# Patient Record
Sex: Male | Born: 1985 | Hispanic: No | Marital: Married | Smoking: Current every day smoker
Health system: Southern US, Community
[De-identification: ages and names within clinical notes are randomized; demographics above are authoritative.]

## PROBLEM LIST (undated history)

## (undated) DIAGNOSIS — J45909 Unspecified asthma, uncomplicated: Secondary | ICD-10-CM

## (undated) DIAGNOSIS — K5010 Crohn's disease of large intestine without complications: Secondary | ICD-10-CM

---

## 2019-04-20 ENCOUNTER — Other Ambulatory Visit: Payer: Self-pay | Admitting: Gastroenterology

## 2019-04-20 DIAGNOSIS — K289 Gastrojejunal ulcer, unspecified as acute or chronic, without hemorrhage or perforation: Secondary | ICD-10-CM

## 2019-04-21 ENCOUNTER — Other Ambulatory Visit: Payer: Self-pay | Admitting: Gastroenterology

## 2019-04-28 ENCOUNTER — Inpatient Hospital Stay: Admission: RE | Admit: 2019-04-28 | Payer: Self-pay | Source: Ambulatory Visit

## 2019-05-02 ENCOUNTER — Other Ambulatory Visit (HOSPITAL_COMMUNITY): Payer: Self-pay | Admitting: Gastroenterology

## 2019-05-02 DIAGNOSIS — K633 Ulcer of intestine: Secondary | ICD-10-CM

## 2019-05-02 DIAGNOSIS — K289 Gastrojejunal ulcer, unspecified as acute or chronic, without hemorrhage or perforation: Secondary | ICD-10-CM

## 2019-05-16 ENCOUNTER — Ambulatory Visit (HOSPITAL_COMMUNITY)
Admission: RE | Admit: 2019-05-16 | Discharge: 2019-05-16 | Disposition: A | Discharge status: Home / Self Care | Payer: No Typology Code available for payment source | Source: Ambulatory Visit | Attending: Gastroenterology | Admitting: Gastroenterology

## 2019-05-16 ENCOUNTER — Other Ambulatory Visit: Payer: Self-pay

## 2019-05-16 DIAGNOSIS — K289 Gastrojejunal ulcer, unspecified as acute or chronic, without hemorrhage or perforation: Secondary | ICD-10-CM | POA: Insufficient documentation

## 2019-05-16 DIAGNOSIS — K633 Ulcer of intestine: Secondary | ICD-10-CM | POA: Diagnosis not present

## 2019-05-16 MED ORDER — IOHEXOL 300 MG/ML  SOLN
100.0000 mL | Freq: Once | INTRAMUSCULAR | Status: AC | PRN
  Administered 2019-05-16: 100 mL via INTRAVENOUS

## 2020-08-07 ENCOUNTER — Emergency Department (HOSPITAL_BASED_OUTPATIENT_CLINIC_OR_DEPARTMENT_OTHER)
Admission: EM | Admit: 2020-08-07 | Discharge: 2020-08-07 | Disposition: A | Discharge status: Home / Self Care | Payer: No Typology Code available for payment source | Attending: Emergency Medicine | Admitting: Emergency Medicine

## 2020-08-07 ENCOUNTER — Encounter (HOSPITAL_BASED_OUTPATIENT_CLINIC_OR_DEPARTMENT_OTHER): Payer: Self-pay | Admitting: Obstetrics and Gynecology

## 2020-08-07 ENCOUNTER — Other Ambulatory Visit: Payer: Self-pay

## 2020-08-07 ENCOUNTER — Other Ambulatory Visit (HOSPITAL_BASED_OUTPATIENT_CLINIC_OR_DEPARTMENT_OTHER): Payer: Self-pay

## 2020-08-07 DIAGNOSIS — F17210 Nicotine dependence, cigarettes, uncomplicated: Secondary | ICD-10-CM | POA: Insufficient documentation

## 2020-08-07 DIAGNOSIS — H8112 Benign paroxysmal vertigo, left ear: Secondary | ICD-10-CM | POA: Insufficient documentation

## 2020-08-07 DIAGNOSIS — R110 Nausea: Secondary | ICD-10-CM | POA: Diagnosis not present

## 2020-08-07 DIAGNOSIS — R42 Dizziness and giddiness: Secondary | ICD-10-CM | POA: Diagnosis present

## 2020-08-07 DIAGNOSIS — J45909 Unspecified asthma, uncomplicated: Secondary | ICD-10-CM | POA: Diagnosis not present

## 2020-08-07 HISTORY — DX: Crohn's disease of large intestine without complications: K50.10

## 2020-08-07 HISTORY — DX: Unspecified asthma, uncomplicated: J45.909

## 2020-08-07 MED ORDER — MECLIZINE HCL 25 MG PO TABS
25.0000 mg | ORAL_TABLET | Freq: Three times a day (TID) | ORAL | 0 refills | Status: AC | PRN
  Filled 2020-08-07: qty 30, 10d supply, fill #0

## 2020-08-07 MED ORDER — MECLIZINE HCL 25 MG PO TABS
25.0000 mg | ORAL_TABLET | Freq: Once | ORAL | Status: AC
  Administered 2020-08-07: 25 mg via ORAL
  Filled 2020-08-07: qty 1

## 2020-08-07 MED ORDER — DEXAMETHASONE 6 MG PO TABS
10.0000 mg | ORAL_TABLET | Freq: Once | ORAL | Status: AC
  Administered 2020-08-07: 10 mg via ORAL
  Filled 2020-08-07: qty 1

## 2020-08-07 NOTE — ED Provider Notes (Signed)
[facility] EMERGENCY DEPT Provider Note   CSN: [id number] Arrival date & time: 08/07/20  1404     History Chief Complaint  Patient presents with   Dizziness    Edward Brady is a 35 y.o. male.  35 yo M with a chief complaints of dizziness.  This seems to come and go and is worse with certain head positions.  Has been going on for about a week and a half.  Try to make an appointment with an ENT and was told to come here to be evaluated for stroke.  Has had some nausea and some increased fatigue.  Denies one-sided numbness or weakness denies difficulty with speech or swallowing.  Has a mild headache with this.  Denies ear pain or hearing loss.  The history is provided by the patient.  Dizziness Quality:  Head spinning and imbalance Severity:  Moderate Onset quality:  Gradual Duration:  10 days Timing:  Intermittent Progression:  Waxing and waning Chronicity:  New Context: eye movement, head movement and standing up   Relieved by:  Being still and closing eyes Worsened by:  Nothing Ineffective treatments:  None tried Associated symptoms: nausea   Associated symptoms: no chest pain, no diarrhea, no headaches, no palpitations, no shortness of breath and no vomiting       Past Medical History:  Diagnosis Date   Asthma    Crohn's colitis (HCC)     There are no problems to display for this patient.   History reviewed. No pertinent surgical history.     History reviewed. No pertinent family history.  Social History   Tobacco Use   Smoking status: Every Day    Pack years: 0.00    Types: Cigarettes  Vaping Use   Vaping Use: Never used  Substance Use Topics   Alcohol use: Never    Home Medications Prior to Admission medications   Medication Sig Start Date End Date Taking? Authorizing Provider  azaTHIOprine (IMURAN) 50 MG tablet Take 50 mg by mouth daily. 07/20/20  Yes [provider]  HUMIRA PEN 40 MG/0.4ML PNKT Inject 40 mg as directed every  14 (fourteen) days. 07/10/20  Yes [provider]  meclizine (ANTIVERT) 25 MG tablet Take 1 tablet (25 mg total) by mouth 3 (three) times daily as needed for dizziness. 08/07/20  Yes Melene Plan, DO    Allergies    Patient has no allergy information on record.  Review of Systems   Review of Systems  Constitutional:  Positive for fatigue. Negative for chills and fever.  HENT:  Negative for congestion and facial swelling.   Eyes:  Negative for discharge and visual disturbance.  Respiratory:  Negative for shortness of breath.   Cardiovascular:  Negative for chest pain and palpitations.  Gastrointestinal:  Positive for nausea. Negative for abdominal pain, diarrhea and vomiting.  Musculoskeletal:  Negative for arthralgias and myalgias.  Skin:  Negative for color change and rash.  Neurological:  Positive for dizziness. Negative for tremors, syncope and headaches.  Psychiatric/Behavioral:  Negative for confusion and dysphoric mood.    Physical Exam Updated Vital Signs BP 139/83 (BP Location: Right Arm)   Pulse 68   Temp 97.8 F (36.6 C) (Oral)   Resp 15   SpO2 99%   Physical Exam Vitals and nursing note reviewed.  Constitutional:      Appearance: He is well-developed.  HENT:     Head: Normocephalic and atraumatic.  Eyes:     Pupils: Pupils are equal, round, and  reactive to light.  Neck:     Vascular: No JVD.  Cardiovascular:     Rate and Rhythm: Normal rate and regular rhythm.     Heart sounds: No murmur heard.   No friction rub. No gallop.  Pulmonary:     Effort: No respiratory distress.     Breath sounds: No wheezing.  Abdominal:     General: There is no distension.     Tenderness: There is no abdominal tenderness. There is no guarding or rebound.  Musculoskeletal:        General: Normal range of motion.     Cervical back: Normal range of motion and neck supple.  Skin:    Coloration: Skin is not pale.     Findings: No rash.  Neurological:     Mental Status: He  is alert and oriented to person, place, and time.     Cranial Nerves: Cranial nerves are intact.     Sensory: Sensation is intact.     Motor: Motor function is intact.     Coordination: Coordination is intact.     Comments: Right-sided fast-growing nystagmus.  Ambulates without difficulty.  Benign neuro exam otherwise.  Psychiatric:        Behavior: Behavior normal.    ED Results / Procedures / Treatments   Labs (all labs ordered are listed, but only abnormal results are displayed) Labs Reviewed - No data to display  EKG None  Radiology No results found.  Procedures Procedures  CPT [id number].  I discussed therapeutic maneuver for the patient's BPPV.  The patient consented to the procedure.  The patient went through a series of had maneuvers with transient dizziness and improvement.  Feeling mildly better on reassessment.  Medications Ordered in ED Medications  meclizine (ANTIVERT) tablet 25 mg (has no administration in time range)  dexamethasone (DECADRON) tablet 10 mg (has no administration in time range)    ED Course  I have reviewed the triage vital signs and the nursing notes.  Pertinent labs & imaging results that were available during my care of the patient were reviewed by me and considered in my medical decision making (see chart for details).    MDM Rules/Calculators/A&P                          35 yo M with a chief complaints of episodic dizziness that is worse with head positions and resolves when holds his head still.  Most consistent with BPPV.  Dix-Hallpike positive.  Epley maneuver without resolution but mild improvement.  We will have him follow-up with ENT in the office.  Meclizine.  Dose of Decadron for possible labyrinthitis  3:01 PM:  I have discussed the diagnosis/risks/treatment options with the patient and friend  and believe the pt to be eligible for discharge home to follow-up with ENT. We also discussed returning to the ED immediately if new or  worsening sx occur. We discussed the sx which are most concerning (e.g., sudden worsening pain, fever, inability to tolerate by mouth, stroke s/sx) that necessitate immediate return. Medications administered to the patient during their visit and any new prescriptions provided to the patient are listed below.  Medications given during this visit Medications  meclizine (ANTIVERT) tablet 25 mg (has no administration in time range)  dexamethasone (DECADRON) tablet 10 mg (has no administration in time range)     The patient appears reasonably screen and/or stabilized for discharge and I doubt any other medical condition  or other [facility] requiring further screening, evaluation, or treatment in the ED at this time prior to discharge.   Final Clinical Impression(s) / ED Diagnoses Final diagnoses:  BPPV (benign paroxysmal positional vertigo), left    Rx / DC Orders ED Discharge Orders          Ordered    meclizine (ANTIVERT) 25 MG tablet  3 times daily PRN        08/07/20 1452             Melene Plan, DO 08/07/20 1501

## 2020-08-07 NOTE — ED Notes (Signed)
Patient verbalizes understanding of discharge instructions. Opportunity for questioning and answers were provided. Patient discharged from ED.  °

## 2020-08-07 NOTE — ED Triage Notes (Signed)
Patient was sent to the ER by his ENT for dizziness x10 days to rule out stroke. Patient reports increased head pain and dizziness over the last 10 days.

## 2020-08-07 NOTE — Discharge Instructions (Addendum)
Follow up with ENT in the office.  Please return for worsening symptoms difficulty talking swallowing one-sided numbness or weakness.

## 2020-10-28 IMAGING — CT CT ENTEROGRAPHY (ABD-PELV W/ CM)
2 of 6 series · 16 of 46 positions shown, 18 images · IV contrast (Omni 300)
Comparison: None.

CLINICAL DATA: Diffuse ulcerations of jejunum and ileum, abdominal
pain

EXAM:
CT ABDOMEN AND PELVIS WITH CONTRAST (ENTEROGRAPHY)
TECHNIQUE: Multidetector CT of the abdomen and pelvis during bolus
administration of intravenous contrast. Negative oral contrast was
given.
CONTRAST:  100mL OMNIPAQUE IOHEXOL 300 MG/ML  SOLN

[Series 4: entero thins 2mm · axial · 0.86mm/px · z∈[+778,+1220]mm · 13 of 247 slices shown, 15 images]
[im 13/247  soft-tissue]
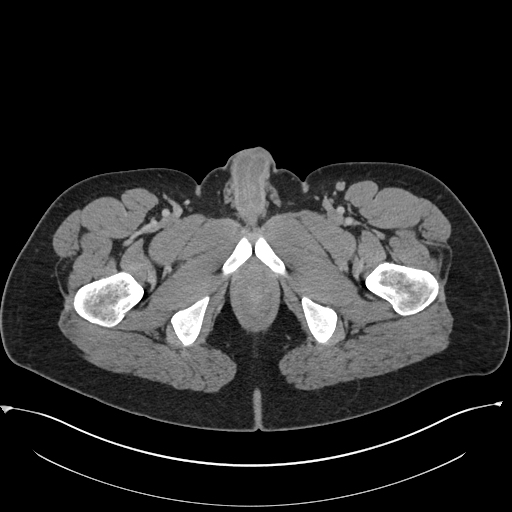
[im 13/247  bone]
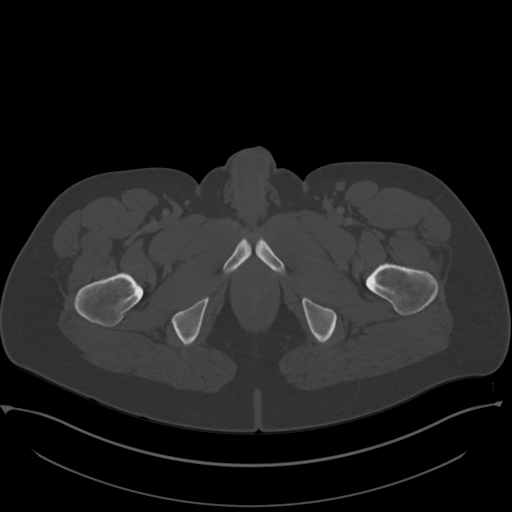
[im 39/247  soft-tissue]
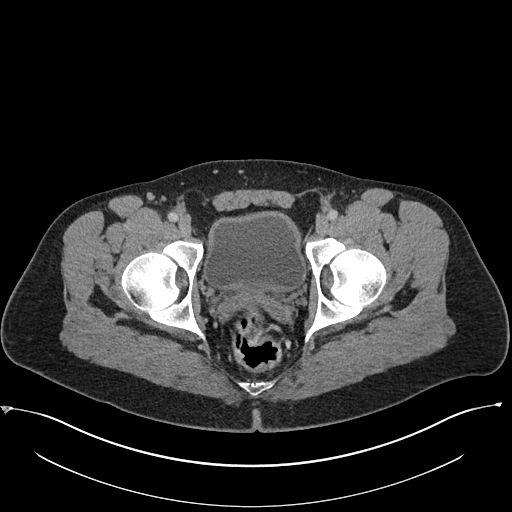
[im 52/247  soft-tissue]
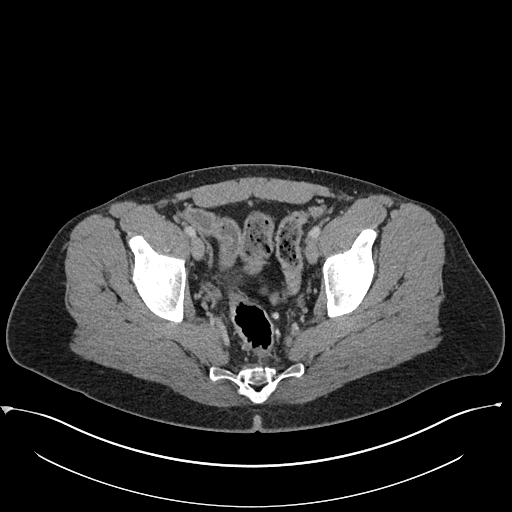
[im 65/247  soft-tissue]
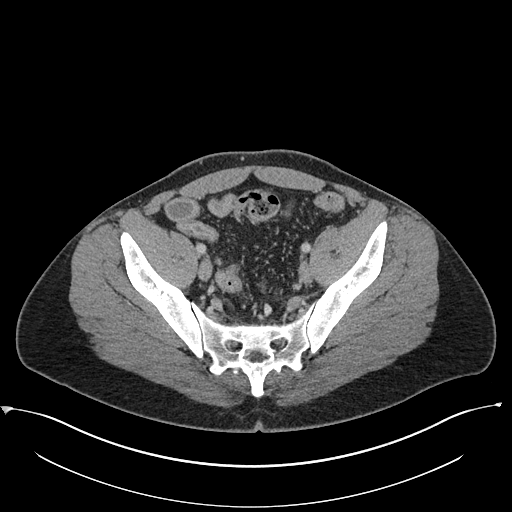
[im 91/247  soft-tissue]
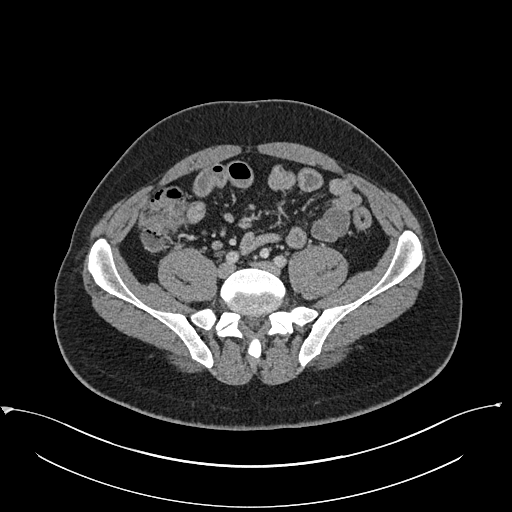
[im 104/247  soft-tissue]
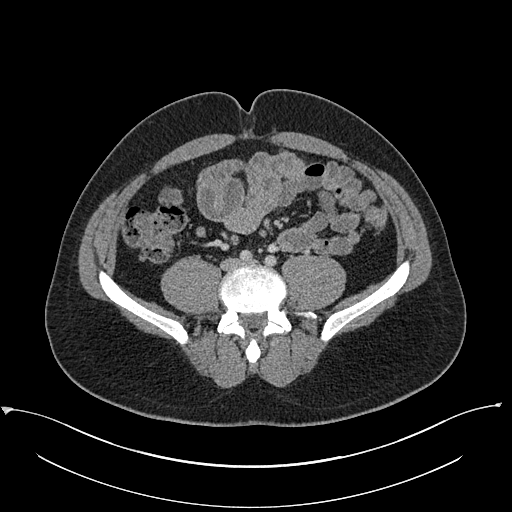
[im 130/247  soft-tissue]
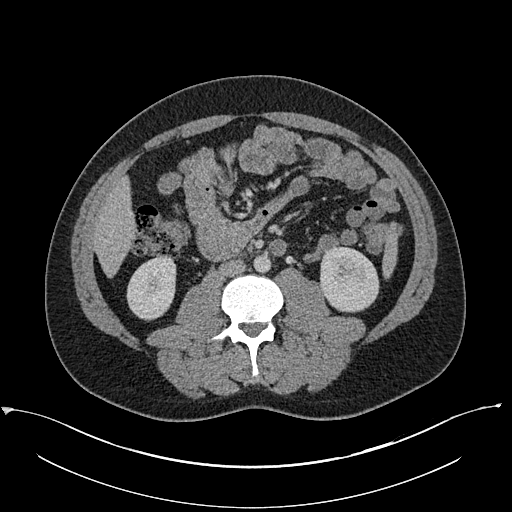
[im 143/247  soft-tissue]
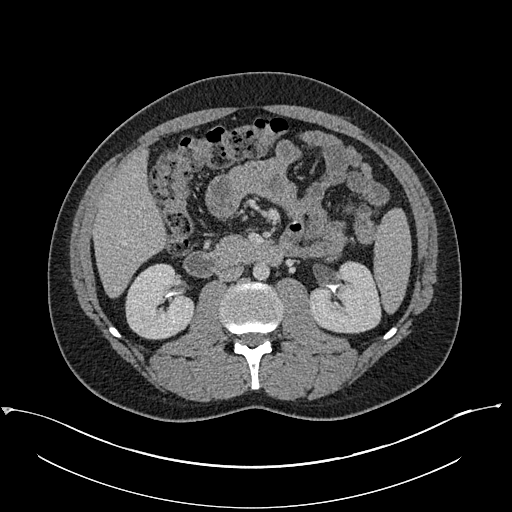
[im 156/247  soft-tissue]
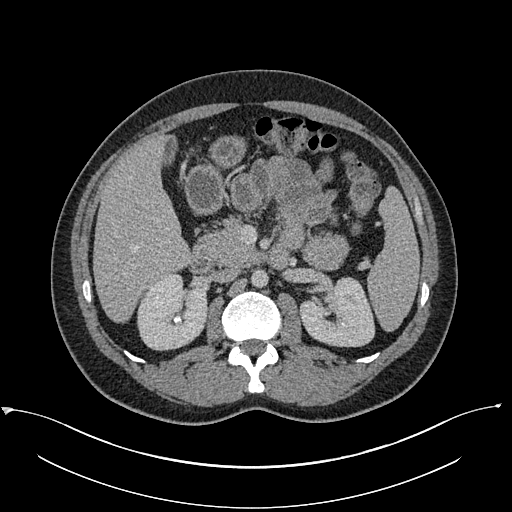
[im 156/247  bone]
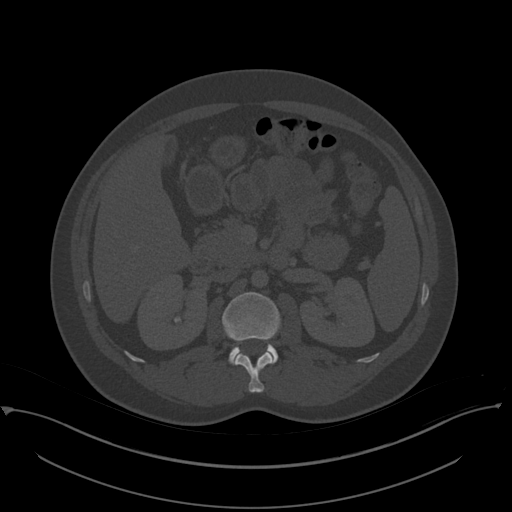
[im 182/247  soft-tissue]
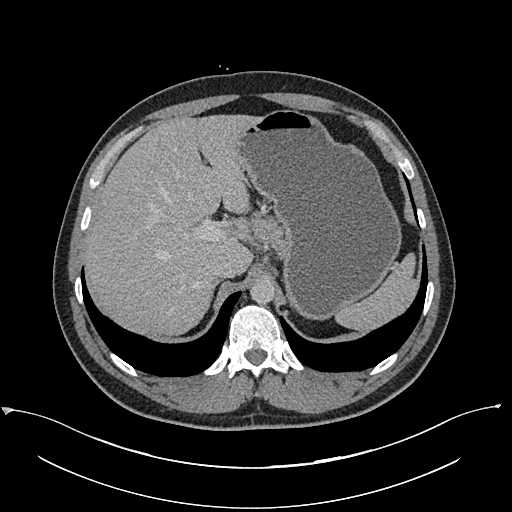
[im 195/247  soft-tissue]
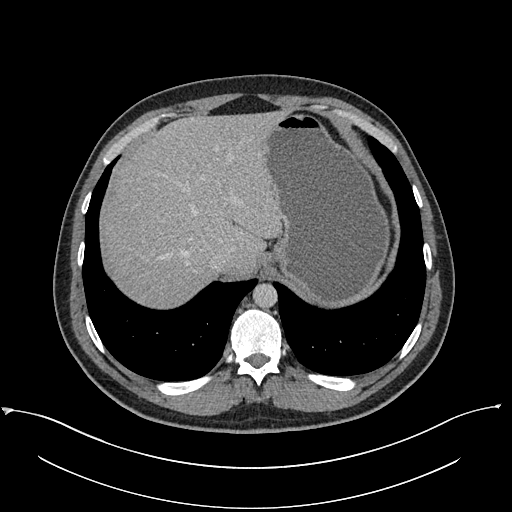
[im 208/247  soft-tissue]
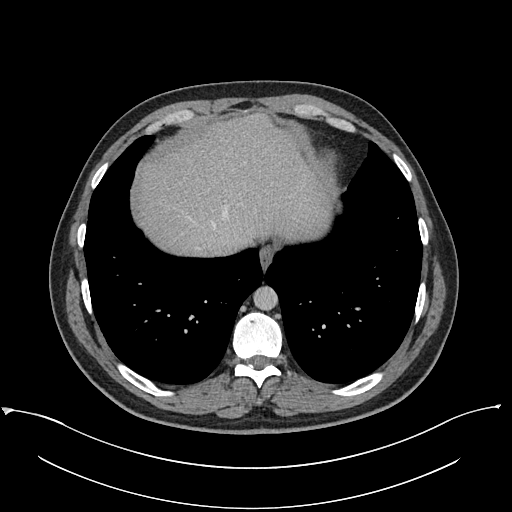
[im 234/247  soft-tissue]
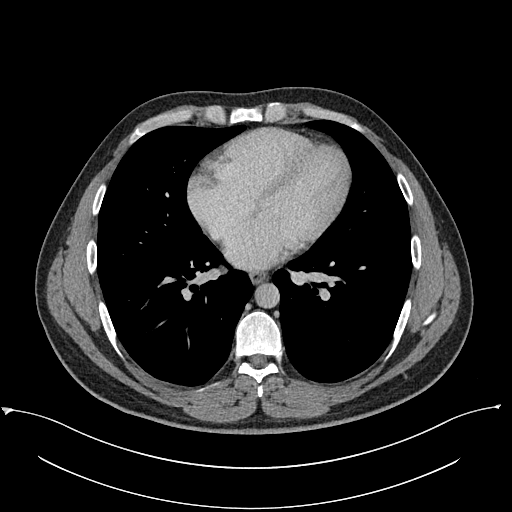

[Series 6: entero 2.0 cor · coronal · 0.87mm/px · 3 of 165 slices shown]
[im 55/165  soft-tissue]
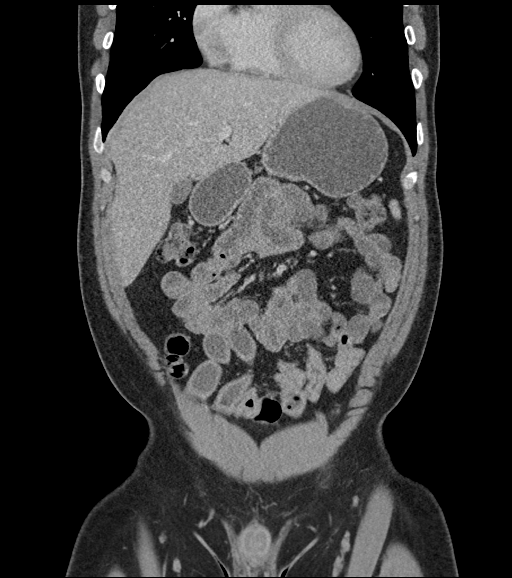
[im 73/165  soft-tissue]
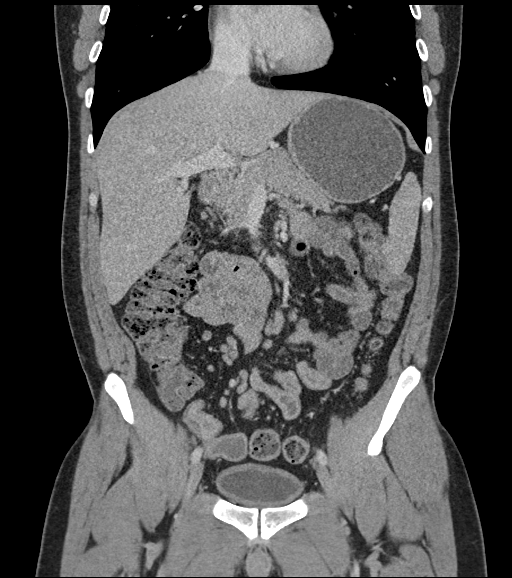
[im 92/165  soft-tissue]
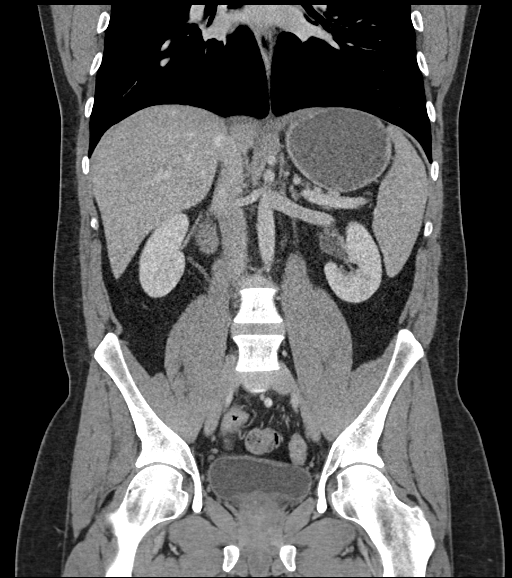

[16 of 46 positions shown; findings below may reference images not displayed]

FINDINGS: Lower chest: No acute abnormality.

Hepatobiliary: No solid liver abnormality is seen. No gallstones,
gallbladder wall thickening, or biliary dilatation.

Pancreas: Unremarkable. No pancreatic ductal dilatation or
surrounding inflammatory changes.

Spleen: Normal in size without significant abnormality.

Adrenals/Urinary Tract: Adrenal glands are unremarkable. Kidneys are
normal, without renal calculi, solid lesion, or hydronephrosis.
Bladder is unremarkable.

Stomach/Bowel: Stomach is within normal limits. Appendix appears
normal. There is inflammatory thickening and mucosal
hyperenhancement of approximately 20-30 cm of terminal ileum,
without evidence to suggest stricture, fistula, or abscess.

Vascular/Lymphatic: No significant vascular findings are present.
Prominent right lower quadrant and small bowel mesenteric lymph
nodes.

Reproductive: No mass or other significant abnormality.

Other: No abdominal wall hernia or abnormality. No abdominopelvic
ascites.

Musculoskeletal: No acute or significant osseous findings.
IMPRESSION: Inflammatory thickening and mucosal hyperenhancement of
approximately 20-30 cm of terminal ileum, without evidence to
suggest stricture, fistula, or abscess. Prominent right lower
quadrant and small bowel mesenteric lymph nodes. Constellation of
findings is consistent with nonspecific infectious or inflammatory
ileitis, particularly including Crohn's disease given this
appearance.

## 2022-05-02 ENCOUNTER — Other Ambulatory Visit: Payer: Self-pay | Admitting: Gastroenterology

## 2022-05-02 DIAGNOSIS — R7989 Other specified abnormal findings of blood chemistry: Secondary | ICD-10-CM

## 2022-05-26 ENCOUNTER — Other Ambulatory Visit: Payer: No Typology Code available for payment source

## 2022-06-02 ENCOUNTER — Ambulatory Visit
Admission: RE | Admit: 2022-06-02 | Discharge: 2022-06-02 | Disposition: A | Discharge status: Home / Self Care | Payer: Self-pay | Source: Ambulatory Visit | Attending: Gastroenterology | Admitting: Gastroenterology

## 2022-06-02 DIAGNOSIS — R7989 Other specified abnormal findings of blood chemistry: Secondary | ICD-10-CM

## 2022-07-17 ENCOUNTER — Other Ambulatory Visit (HOSPITAL_COMMUNITY): Payer: Self-pay | Admitting: Gastroenterology

## 2022-07-17 DIAGNOSIS — R1011 Right upper quadrant pain: Secondary | ICD-10-CM

## 2022-08-25 ENCOUNTER — Encounter (HOSPITAL_COMMUNITY): Payer: Self-pay

## 2022-08-25 ENCOUNTER — Encounter (HOSPITAL_COMMUNITY): Admission: RE | Admit: 2022-08-25 | Payer: 59 | Source: Ambulatory Visit

## 2022-09-16 ENCOUNTER — Ambulatory Visit (HOSPITAL_COMMUNITY): Payer: 59
